# Patient Record
Sex: Female | Born: 1950 | Hispanic: Yes | Marital: Married | Smoking: Never smoker
Health system: Southern US, Community
[De-identification: ages and names within clinical notes are randomized; demographics above are authoritative.]

## PROBLEM LIST (undated history)

## (undated) DIAGNOSIS — R7303 Prediabetes: Secondary | ICD-10-CM

## (undated) DIAGNOSIS — K219 Gastro-esophageal reflux disease without esophagitis: Secondary | ICD-10-CM

## (undated) DIAGNOSIS — I10 Essential (primary) hypertension: Secondary | ICD-10-CM

## (undated) HISTORY — PX: NO PAST SURGERIES: SHX2092

---

## 2005-05-03 ENCOUNTER — Ambulatory Visit: Payer: Self-pay

## 2006-05-09 ENCOUNTER — Ambulatory Visit: Payer: Self-pay

## 2006-06-06 ENCOUNTER — Ambulatory Visit: Payer: Self-pay | Admitting: *Deleted

## 2007-01-24 ENCOUNTER — Ambulatory Visit: Payer: Self-pay

## 2008-01-11 ENCOUNTER — Other Ambulatory Visit: Payer: Self-pay

## 2008-01-11 ENCOUNTER — Inpatient Hospital Stay: Payer: Self-pay | Admitting: Internal Medicine

## 2008-03-18 ENCOUNTER — Ambulatory Visit: Payer: Self-pay

## 2011-08-15 ENCOUNTER — Ambulatory Visit: Payer: Self-pay

## 2013-02-19 ENCOUNTER — Ambulatory Visit: Payer: Self-pay

## 2014-05-03 ENCOUNTER — Emergency Department: Payer: Self-pay | Admitting: Emergency Medicine

## 2014-05-03 LAB — BASIC METABOLIC PANEL
ANION GAP: 7 (ref 7–16)
BUN: 18 mg/dL (ref 7–18)
CHLORIDE: 103 mmol/L (ref 98–107)
CO2: 29 mmol/L (ref 21–32)
Calcium, Total: 8.6 mg/dL (ref 8.5–10.1)
Creatinine: 1.17 mg/dL (ref 0.60–1.30)
EGFR (Non-African Amer.): 51 — ABNORMAL LOW
GFR CALC AF AMER: 59 — AB
GLUCOSE: 100 mg/dL — AB (ref 65–99)
OSMOLALITY: 280 (ref 275–301)
POTASSIUM: 3.5 mmol/L (ref 3.5–5.1)
Sodium: 139 mmol/L (ref 136–145)

## 2014-05-03 LAB — URINALYSIS, COMPLETE
Bilirubin,UR: NEGATIVE
GLUCOSE, UR: NEGATIVE mg/dL (ref 0–75)
KETONE: NEGATIVE
LEUKOCYTE ESTERASE: NEGATIVE
NITRITE: NEGATIVE
PH: 8 (ref 4.5–8.0)
Protein: NEGATIVE
RBC,UR: 11 /HPF (ref 0–5)
Specific Gravity: 1.005 (ref 1.003–1.030)
Squamous Epithelial: <1
WBC UR: 7 /HPF (ref 0–5)

## 2014-05-03 LAB — CBC WITH DIFFERENTIAL/PLATELET
BASOS ABS: 0.1 10*3/uL (ref 0.0–0.1)
Basophil %: 0.6 %
Eosinophil #: 0.1 10*3/uL (ref 0.0–0.7)
Eosinophil %: 1.6 %
HCT: 39.6 % (ref 35.0–47.0)
HGB: 13.1 g/dL (ref 12.0–16.0)
LYMPHS ABS: 1.9 10*3/uL (ref 1.0–3.6)
Lymphocyte %: 23.9 %
MCH: 28.4 pg (ref 26.0–34.0)
MCHC: 33.1 g/dL (ref 32.0–36.0)
MCV: 86 fL (ref 80–100)
Monocyte #: 0.6 x10 3/mm (ref 0.2–0.9)
Monocyte %: 8 %
NEUTROS ABS: 5.3 10*3/uL (ref 1.4–6.5)
Neutrophil %: 65.9 %
Platelet: 280 10*3/uL (ref 150–440)
RBC: 4.61 10*6/uL (ref 3.80–5.20)
RDW: 13.8 % (ref 11.5–14.5)
WBC: 8 10*3/uL (ref 3.6–11.0)

## 2014-05-03 LAB — TROPONIN I: Troponin-I: <0.02 ng/mL

## 2014-05-20 ENCOUNTER — Ambulatory Visit: Payer: Self-pay

## 2015-12-29 ENCOUNTER — Ambulatory Visit: Payer: Self-pay

## 2016-01-12 ENCOUNTER — Ambulatory Visit: Payer: Self-pay | Attending: Oncology | Admitting: *Deleted

## 2016-01-12 ENCOUNTER — Encounter: Payer: Self-pay | Admitting: *Deleted

## 2016-01-12 VITALS — BP 134/75 | HR 70 | Temp 97.1°F | Ht 60.24 in | Wt 165.3 lb

## 2016-01-12 DIAGNOSIS — N644 Mastodynia: Secondary | ICD-10-CM

## 2016-01-12 NOTE — Patient Instructions (Signed)
Gave patient hand-out, Women Staying Healthy, Active and Well from [facility], with education on breast health, pap smears, heart and colon health. 

## 2016-01-12 NOTE — Progress Notes (Signed)
Subjective:     Patient ID: Cristal FordCelia Martinez- Clarisa Flinge cruz, female   DOB: 09/24/1950, 65 y.o.   MRN: [id number]  HPI   Review of Systems     Objective:   Physical Exam  Pulmonary/Chest: Right breast exhibits no inverted nipple, no mass, no nipple discharge, no skin change and no tenderness. Left breast exhibits no mass, no nipple discharge, no skin change and no tenderness. Breasts are symmetrical.  Patient complains of a "throbbing" left nipple       Assessment:     65 year old Hispanic [facility]  Lloyda, the interpreter present during the interview and exam.  Patient states she has been experiencing a "throbbing" left nipple over the past several months.  States its intermediate, lasting only about a minute.  Denies any aggravating or alleviating factors.  Taught self breast awareness.  Last mammogram on 05/20/14 was a birads 1.  Patient has been screened for eligibility.  She does not have any insurance, Medicare or Medicaid.  She also meets financial eligibility.  Hand-out given on the Affordable Care Act.      Plan:     Will get bilateral diagnostic mammogram and ultrasound.  If no findings on imaging will have patient return in 3 months for re-exam.  She is agreeable to the plan.

## 2016-01-20 ENCOUNTER — Ambulatory Visit
Admission: RE | Admit: 2016-01-20 | Discharge: 2016-01-20 | Disposition: A | Discharge status: Home / Self Care | Payer: Self-pay | Source: Ambulatory Visit | Attending: Oncology | Admitting: Oncology

## 2016-01-20 DIAGNOSIS — N644 Mastodynia: Secondary | ICD-10-CM

## 2016-03-16 ENCOUNTER — Encounter: Payer: Self-pay | Admitting: *Deleted

## 2016-03-16 NOTE — Progress Notes (Signed)
Letter mailed from the [facility] to inform patient of her normal mammogram results.  Patient is to follow-up with cbe on 04/20/16 with [facility].  Next  annual screening in one year.  [facility] [place].

## 2016-04-19 ENCOUNTER — Ambulatory Visit: Payer: Self-pay

## 2017-03-12 ENCOUNTER — Ambulatory Visit: Payer: Self-pay | Attending: Oncology | Admitting: *Deleted

## 2017-03-12 ENCOUNTER — Encounter: Payer: Self-pay | Admitting: *Deleted

## 2017-03-12 ENCOUNTER — Ambulatory Visit
Admission: RE | Admit: 2017-03-12 | Discharge: 2017-03-12 | Disposition: A | Discharge status: Home / Self Care | Payer: Self-pay | Source: Ambulatory Visit | Attending: Oncology | Admitting: Oncology

## 2017-03-12 VITALS — BP 163/77 | HR 60 | Temp 98.4°F | Ht 62.0 in | Wt 168.0 lb

## 2017-03-12 DIAGNOSIS — Z0000 Encounter for general adult medical examination without abnormal findings: Secondary | ICD-10-CM

## 2017-03-12 NOTE — Progress Notes (Signed)
Subjective:     Patient ID: Molly Salas- Molly Salas, female   DOB: 26-Apr-1951, 66 y.o.   MRN: [id number]  HPI   Review of Systems     Objective:   Physical Exam  Pulmonary/Chest: Right breast exhibits no inverted nipple, no mass, no nipple discharge, no skin change and no tenderness. Left breast exhibits no inverted nipple, no mass, no nipple discharge, no skin change and no tenderness. Breasts are symmetrical.       Assessment:     66 [facility] Hispanic [facility]  Orson SlickJacqui, the interpreter present during the interview and exam.  Clinical breast exam unremarkable.  Patient states she has had 2 episodes of her left nipple throbbing since this time last year.  No aggravating or alleviating factors.  Blood pressure elevated at  163/77.  She is to pick up her meds since she has been out of them for a couple of days and then recheck her blood pressure at [facility] or [facility], and if remains higher than 140/90 she is to follow-up with her primary care provider.  Hand out on hypertention given to patient.  [facility] per [facility].  Next pap will be due in 2022 or per her results.  Patient has been screened for eligibility.  She does not have any insurance, Medicare or Medicaid.  She also meets financial eligibility.  Hand-out given on the Affordable Care Act.    Plan:     Screening mammogram ordered.  She is to take notes as to any factors associated with the "nipple throbbing" if she experiences it again.  She is agreeable.  Will follow-up per [facility] protocol.

## 2017-03-12 NOTE — Progress Notes (Signed)
Letter mailed to inform patient of her normal mammogram results.  She is to follow-up in one year with her annual screening mammogram.  [place]

## 2017-03-12 NOTE — Patient Instructions (Signed)
Hipertensin Hypertension La hipertensin, conocida comnmente como presin arterial alta, se produce cuando la sangre bombea en las arterias con mucha fuerza. Las arterias son los vasos sanguneos que transportan la sangre desde el corazn al resto del cuerpo. La hipertensin hace que el corazn haga ms esfuerzo para bombear sangre y puede provocar que las arterias se estrechen o endurezcan. La hipertensin no tratada o no controlada puede causar infarto de miocardio, accidentes cerebrovasculares, enfermedad renal y otros problemas. Una lectura de la presin arterial consiste de un nmero ms alto sobre un nmero ms bajo. En condiciones ideales, la presin arterial debe estar por debajo de 120/80. El primer nmero ("superior") es la presin sistlica. Es la medida de la presin de las arterias cuando el corazn late. El segundo nmero ("inferior") es la presin diastlica. Es la medida de la presin en las arterias cuando el corazn se relaja. Cules son las causas? Se desconoce la causa de esta afeccin. Qu incrementa el riesgo? Algunos factores de riesgo de hipertensin estn bajo su control. Otros no. Factores que puede modificar  Fumar.  Tener diabetes mellitus tipo 2, colesterol alto, o ambos.  No hacer la cantidad suficiente de actividad fsica o ejercicio.  Tener sobrepeso.  Consumir mucha grasa, azcar, caloras o sal (sodio) en su dieta.  Beber alcohol en exceso. Factores que son difciles o imposibles de modificar  Tener enfermedad renal crnica.  Tener antecedentes familiares de presin arterial alta.  La edad. Los riesgos aumentan con la edad.  La raza. El riesgo es mayor para las personas afroamericanas.  El sexo. Antes de los 45aos, los hombres corren ms riesgo que las mujeres. Despus de los 65aos, las mujeres corren ms riesgo que los hombres.  Tener apnea obstructiva del sueo.  El estrs. Cules son los signos o los sntomas? La presin arterial  extremadamente alta (crisis hipertensiva) puede provocar:  Dolor de cabeza.  Ansiedad.  Falta de aire.  Hemorragia nasal.  Nuseas y vmitos.  Dolor de pecho intenso.  Una crisis de movimientos que no puede controlar (convulsiones).  Cmo se diagnostica? Esta afeccin se diagnostica midiendo su presin arterial mientras se encuentra sentado, con el brazo apoyado sobre una superficie. El brazalete del tensimetro debe colocarse directamente sobre la piel de la parte superior del brazo y al nivel de su corazn. Debe medirla al menos dos veces en el mismo brazo. Determinadas condiciones pueden causar una diferencia de presin arterial entre el brazo izquierdo y el derecho. Ciertos factores pueden provocar que las lecturas de la presin arterial sean inferiores o superiores a lo normal (elevadas) por un perodo corto de tiempo:  Si su presin arterial es ms alta cuando se encuentra en el consultorio del mdico que cuando la mide en su hogar, se denomina "hipertensin de bata blanca". La mayora de las personas que tienen esta afeccin no deben ser medicadas.  Si su presin arterial es ms alta en el hogar que cuando se encuentra en el consultorio del mdico, se denomina "hipertensin enmascarada". La mayora de las personas que tienen esta afeccin deben ser medicadas para controlar la presin arterial.  Si tiene una lecturas de presin arterial alta durante una visita o si tiene presin arterial normal con otros factores de riesgo:  Es posible que se le pida que regrese otro da para volver a controlar su presin arterial.  Se le puede pedir que se controle la presin arterial en su casa durante 1 semana o ms.  Si se le diagnostica hipertensin, es posible que   se le realicen otros anlisis de sangre o estudios de diagnstico por imgenes para ayudar a su mdico a comprender su riesgo general de tener otras afecciones. Cmo se trata? Esta afeccin se trata haciendo cambios saludables  en el estilo de vida, tales como ingerir alimentos saludables, realizar ms ejercicio y reducir el consumo de alcohol. El mdico puede recetarle medicamentos si los cambios en el estilo de vida no son suficientes para lograr controlar la presin arterial y si:  Su presin arterial sistlica est por encima de 130.  Su presin arterial diastlica est por encima de 80.  La presin arterial deseada puede variar en funcin de las enfermedades, la edad y otros factores personales. Siga estas instrucciones en su casa: Comida y bebida  Siga una dieta con alto contenido de fibras y potasio, y con bajo contenido de sodio, azcar agregada y grasas. Un ejemplo de plan alimenticio es la dieta DASH (Dietary Approaches to Stop Hypertension, Mtodos alimenticios para detener la hipertensin). Para alimentarse de esta manera: ? Coma mucha fruta y verdura fresca. Trate de que la mitad del plato de cada comida sea de frutas y verduras. ? Coma cereales integrales, como pasta integral, arroz integral y pan integral. Llene aproximadamente un cuarto del plato con cereales integrales. ? Coma y beba productos lcteos con bajo contenido de grasa, como leche descremada o yogur bajo en grasas. ? Evite la ingesta de cortes de carne grasa, carne procesada o curada, y carne de ave con piel. Llene aproximadamente un cuarto del plato con protenas magras, como pescado, pollo sin piel, frijoles, huevos y tofu. ? Evite ingerir alimentos prehechos o procesados. En general, estos tienen mayor cantidad de sodio, azcar agregada y grasa.  Reduzca su ingesta diaria de sodio. La mayora de las personas que tienen hipertensin deben comer menos de 1500 mg de sodio por da.  Limite el consumo de alcohol a no ms de 1 medida por da si es mujer y no est embarazada y a 2 medidas por da si es hombre. Una medida equivale a 12onzas de cerveza, 5onzas de vino o 1onzas de bebidas alcohlicas de alta graduacin. Estilo de vida  Trabaje  con su mdico para mantener un peso saludable o perder peso. Pregntele cual es su peso recomendado.  Realice al menos 30 minutos de ejercicio que haga que se acelere su corazn (ejercicio aerbico) la mayora de los das de la semana. Estas actividades pueden incluir caminar, nadar o andar en bicicleta.  Incluya ejercicios para fortalecer sus msculos (ejercicios de resistencia), como pilates o levantamiento de pesas, como parte de su rutina semanal de ejercicios. Intente realizar 30minutos de este tipo de ejercicios al menos tres das a la semana.  No consuma ningn producto que contenga nicotina o tabaco, como cigarrillos y cigarrillos electrnicos. Si necesita ayuda para dejar de fumar, consulte al mdico.  Contrlese la presin arterial en su casa segn las indicaciones del mdico.  Concurra a todas las visitas de control como se lo haya indicado el mdico. Esto es importante. Medicamentos  Tome los medicamentos de venta libre y los recetados solamente como se lo haya indicado el mdico. Siga cuidadosamente las indicaciones. Los medicamentos para la presin arterial deben tomarse segn las indicaciones.  No omita las dosis de medicamentos para la presin arterial. Si lo hace, estar en riesgo de tener problemas y puede hacer que los medicamentos sean menos eficaces.  Pregntele a su mdico a qu efectos secundarios o reacciones a los medicamentos debe prestar atencin. Comunquese con   un mdico si:  Piensa que tiene una reaccin a un medicamento que est tomando.  Tiene dolores de cabeza frecuentes (recurrentes).  Siente mareos.  Tiene hinchazn en los tobillos.  Tiene problemas de visin. Solicite ayuda de inmediato si:  Siente un dolor de cabeza intenso o confusin.  Siente debilidad inusual o adormecimiento.  Siente que va a desmayarse.  Siente un dolor intenso en el pecho o el abdomen.  Vomita repetidas veces.  Tiene dificultad para respirar. Resumen  La  hipertensin se produce cuando la sangre bombea en las arterias con mucha fuerza. Si esta afeccin no se controla, podra correr riesgo de tener complicaciones graves.  La presin arterial deseada puede variar en funcin de las enfermedades, la edad y otros factores personales. Para la Franklin Resourcesmayora de las personas, una presin arterial normal es menor que 120/80.  La hipertensin se trata con cambios en el estilo de vida, medicamentos o una combinacin de [place]ambos. Los Danaher Corporationcambios en el estilo de vida incluyen prdida de peso, ingerir alimentos sanos, seguir una dieta baja en sodio, hacer ms ejercicio y Glass blower/designerlimitar el consumo de alcohol. Esta informacin no tiene Theme park managercomo fin reemplazar el consejo del mdico. Asegrese de hacerle al mdico cualquier pregunta que tenga. Document Released: 08/21/2005 Document Revised: 08/02/2016 Document Reviewed: 08/02/2016  Gave patient hand-out, [facility] St[facility] Healthy, [facility] and [facility], with education on breast health, pap smears, heart and colon health. Risk analystlsevier Interactive Patient Education  [employer]2018 Elsevier Inc.

## 2019-06-23 ENCOUNTER — Other Ambulatory Visit: Payer: Self-pay | Admitting: Physician Assistant

## 2019-06-23 DIAGNOSIS — Z1231 Encounter for screening mammogram for malignant neoplasm of breast: Secondary | ICD-10-CM

## 2019-12-09 ENCOUNTER — Emergency Department
Admission: EM | Admit: 2019-12-09 | Discharge: 2019-12-09 | Disposition: A | Discharge status: Home / Self Care | Payer: Medicaid Other | Attending: Emergency Medicine | Admitting: Emergency Medicine

## 2019-12-09 ENCOUNTER — Other Ambulatory Visit: Payer: Self-pay

## 2019-12-09 ENCOUNTER — Emergency Department: Payer: Medicaid Other

## 2019-12-09 DIAGNOSIS — Y9389 Activity, other specified: Secondary | ICD-10-CM | POA: Diagnosis not present

## 2019-12-09 DIAGNOSIS — Z7982 Long term (current) use of aspirin: Secondary | ICD-10-CM | POA: Insufficient documentation

## 2019-12-09 DIAGNOSIS — M25511 Pain in right shoulder: Secondary | ICD-10-CM

## 2019-12-09 DIAGNOSIS — Z79899 Other long term (current) drug therapy: Secondary | ICD-10-CM | POA: Diagnosis not present

## 2019-12-09 DIAGNOSIS — Y999 Unspecified external cause status: Secondary | ICD-10-CM | POA: Insufficient documentation

## 2019-12-09 DIAGNOSIS — X509XXA Other and unspecified overexertion or strenuous movements or postures, initial encounter: Secondary | ICD-10-CM | POA: Diagnosis not present

## 2019-12-09 DIAGNOSIS — Y929 Unspecified place or not applicable: Secondary | ICD-10-CM | POA: Diagnosis not present

## 2019-12-09 DIAGNOSIS — M25512 Pain in left shoulder: Secondary | ICD-10-CM | POA: Diagnosis not present

## 2019-12-09 MED ORDER — LIDOCAINE 5 % EX PTCH: 1.0000 | MEDICATED_PATCH | Freq: Two times a day (BID) | CUTANEOUS | 0 refills | Status: AC

## 2019-12-09 MED ORDER — TRAMADOL HCL 50 MG PO TABS: 50.0000 mg | ORAL_TABLET | Freq: Two times a day (BID) | ORAL | 0 refills | Status: AC | PRN

## 2019-12-09 NOTE — ED Notes (Signed)
Pt states unable to sign for discharge d/t tremors, difficulty holding the pen.  Interpreter used to go over discharge instructions. Pt denies further questions or concerns

## 2019-12-09 NOTE — Discharge Instructions (Addendum)
Follow discharge care instruction take medication as directed.  You need to follow orthopedic for definitive evaluation and treatment of this condition.

## 2019-12-09 NOTE — ED Triage Notes (Addendum)
interpreter used for triage. Pt reports right shoulder pain, worse with use and effort against gravity X 4 months. Reports 09/08/19 is when pain began, possible injury that date-has had massage to arm without relief. Denies CP, denies SOB.  Pt alert and oriented X4, cooperative, RR even and unlabored, color WNL. Pt in NAD.

## 2019-12-09 NOTE — ED Provider Notes (Signed)
[facility] Emergency Department Provider Note   ____________________________________________   First MD Initiated Contact with Patient 12/09/19 1144     (approximate)  I have reviewed the triage vital signs and the nursing notes.   HISTORY  Chief Complaint Shoulder Pain    HPI Molly Salas is a 69 y.o. female patient presents with 4 months of right shoulder pain.  Patient said pain increased with adduction and overhead reaching.  No specific provocative incident for complaint.  Patient also having pain in the left shoulder which is consistent with degenerative changes per x-ray.  Patient rates the pain as a 7/10.  Patient scribed pain is "achy".  Mild relief with anti-inflammatory medications.         History reviewed. No pertinent past medical history.  There are no problems to display for this patient.   History reviewed. No pertinent surgical history.  Prior to Admission medications   Medication Sig Start Date End Date Taking? Authorizing Provider  aspirin EC 81 MG tablet Take 81 mg by mouth daily.   Yes [provider]  gabapentin (NEURONTIN) 300 MG capsule Take 300 mg by mouth at bedtime.   Yes [provider]  lisinopril-hydrochlorothiazide (ZESTORETIC) 10-12.5 MG tablet Take 1 tablet by mouth daily.   Yes [provider]  naproxen (NAPROSYN) 500 MG tablet Take 500 mg by mouth 2 (two) times daily with a meal.   Yes [provider]  hydrochlorothiazide (HYDRODIURIL) 12.5 MG tablet Take 12.5 mg by mouth.    [provider]  lidocaine (LIDODERM) 5 % Place 1 patch onto the skin every 12 (twelve) hours. Remove & Discard patch within 12 hours or as directed by MD 12/09/19 12/08/20  Sable Feil, PA-C  traMADol (ULTRAM) 50 MG tablet Take 1 tablet (50 mg total) by mouth every 12 (twelve) hours as needed. 12/09/19   Sable Feil, PA-C    Allergies Patient has no known allergies.  Family  History  Problem Relation Age of Onset  . Breast cancer Neg Hx     Social History Social History   Tobacco Use  . Smoking status: Never Smoker  Substance Use Topics  . Alcohol use: Not on file  . Drug use: Not on file    Review of Systems Constitutional: No fever/chills Eyes: No visual changes. ENT: No sore throat. Cardiovascular: Denies chest pain. Respiratory: Denies shortness of breath. Gastrointestinal: No abdominal pain.  No nausea, no vomiting.  No diarrhea.  No constipation. Genitourinary: Negative for dysuria. Musculoskeletal: Negative for back pain. Skin: Negative for rash. Neurological: Negative for headaches, focal weakness or numbness. Endocrine:  Hypertension ____________________________________________   PHYSICAL EXAM:  VITAL SIGNS: ED Triage Vitals  Enc Vitals Group     BP 12/09/19 1119 (!) 158/66     Pulse Rate 12/09/19 1119 93     Resp 12/09/19 1119 16     Temp 12/09/19 1119 99 F (37.2 C)     Temp Source 12/09/19 1119 Oral     SpO2 12/09/19 1119 99 %     Weight 12/09/19 1129 150 lb (68 kg)     Height 12/09/19 1129 5\' 3"  (1.6 m)     Head Circumference --      Peak Flow --      Pain Score 12/09/19 1129 7     Pain Loc --      Pain Edu? --      Excl. in [place]? --    Constitutional:  Alert and oriented. Well appearing and in no acute distress. Neck: No cervical spine tenderness to palpation. Hematological/Lymphatic/Immunilogical: No cervical lymphadenopathy. Cardiovascular: Normal rate, regular rhythm. Grossly normal heart sounds.  Good peripheral circulation.  Elevated blood pressure. Respiratory: Normal respiratory effort.  No retractions. Lungs CTAB. Musculoskeletal: No obvious deformity to the right shoulder.  Patient has decreased range of motion with adduction overhead reaching.  Patient is moderate guarding palpation at the humeral head.  No lower extremity tenderness nor edema.  No joint effusions. Neurologic:  Normal speech and language. No  gross focal neurologic deficits are appreciated. No gait instability. Skin:  Skin is warm, dry and intact. No rash noted. Psychiatric: Mood and affect are normal. Speech and behavior are normal.  ____________________________________________   LABS (all labs ordered are listed, but only abnormal results are displayed)  Labs Reviewed - No data to display ____________________________________________  EKG   ____________________________________________  RADIOLOGY  ED MD interpretation:    Official radiology report(s): DG Shoulder Right  Result Date: 12/09/2019 CLINICAL DATA:  Four months of increasing nontraumatic right shoulder pain EXAM: RIGHT SHOULDER - 2+ VIEW COMPARISON:  None FINDINGS: There is no evidence of fracture or dislocation. Mild acromioclavicular degenerative changes. Potential enthesopathy along the greater tuberosity. IMPRESSION: No acute fracture or dislocation. Mild acromioclavicular degenerative changes. Electronically Signed   By: Donzetta Kohut M.D.   On: 12/09/2019 12:20    ____________________________________________   PROCEDURES  Procedure(s) performed (including Critical Care):  Procedures   ____________________________________________   INITIAL IMPRESSION / ASSESSMENT AND PLAN / ED COURSE  As part of my medical decision making, I reviewed the following data within the electronic MEDICAL RECORD NUMBER     Right shoulder pain secondary to degenerative changes.  Discussed x-ray findings with patient.  Patient given discharge care instruction advised follow orthopedic for  definitive evaluation and treatment.     ____________________________________________   FINAL CLINICAL IMPRESSION(S) / ED DIAGNOSES  Final diagnoses:  Acute pain of right shoulder     ED Discharge Orders         Ordered    traMADol (ULTRAM) 50 MG tablet  Every 12 hours PRN     12/09/19 1254    lidocaine (LIDODERM) 5 %  Every 12 hours     12/09/19 1254            Note:  This document was prepared using Dragon voice recognition software and may include unintentional dictation errors.    Joni Reining, PA-C 12/09/19 1258    Dionne Bucy, MD 12/09/19 [id number]

## 2019-12-09 NOTE — ED Notes (Addendum)
See triage note  Presents with right shoulder pain which started about 4 months ago   States she did not fall but did a lot of cleaning   Pain is anterior shoulder and into arm

## 2020-06-28 ENCOUNTER — Ambulatory Visit
Admission: RE | Admit: 2020-06-28 | Discharge: 2020-06-28 | Disposition: A | Discharge status: Home / Self Care | Payer: Medicaid Other | Source: Ambulatory Visit | Attending: Physician Assistant | Admitting: Physician Assistant

## 2020-06-28 ENCOUNTER — Other Ambulatory Visit: Payer: Self-pay

## 2020-06-28 DIAGNOSIS — Z1231 Encounter for screening mammogram for malignant neoplasm of breast: Secondary | ICD-10-CM | POA: Insufficient documentation

## 2021-07-12 ENCOUNTER — Other Ambulatory Visit: Payer: Self-pay | Admitting: Physician Assistant

## 2021-07-12 DIAGNOSIS — Z1231 Encounter for screening mammogram for malignant neoplasm of breast: Secondary | ICD-10-CM

## 2021-07-18 ENCOUNTER — Ambulatory Visit
Admission: RE | Admit: 2021-07-18 | Discharge: 2021-07-18 | Disposition: A | Discharge status: Home / Self Care | Payer: Medicaid Other | Source: Ambulatory Visit | Attending: Physician Assistant | Admitting: Physician Assistant

## 2021-07-18 ENCOUNTER — Other Ambulatory Visit: Payer: Self-pay

## 2021-07-18 DIAGNOSIS — Z1231 Encounter for screening mammogram for malignant neoplasm of breast: Secondary | ICD-10-CM | POA: Insufficient documentation

## 2022-03-29 ENCOUNTER — Encounter: Payer: Self-pay | Admitting: Ophthalmology

## 2022-04-03 NOTE — Discharge Instructions (Signed)

## 2022-04-05 ENCOUNTER — Ambulatory Visit
Admission: RE | Admit: 2022-04-05 | Discharge: 2022-04-05 | Disposition: A | Discharge status: Home / Self Care | Payer: Medicaid Other | Attending: Ophthalmology | Admitting: Ophthalmology

## 2022-04-05 ENCOUNTER — Encounter: Payer: Self-pay | Admitting: Ophthalmology

## 2022-04-05 ENCOUNTER — Encounter
Admission: RE | Disposition: A | Discharge status: Home / Self Care | Payer: Self-pay | Source: Home / Self Care | Attending: Ophthalmology

## 2022-04-05 ENCOUNTER — Ambulatory Visit: Payer: Medicaid Other | Admitting: Anesthesiology

## 2022-04-05 ENCOUNTER — Ambulatory Visit (AMBULATORY_SURGERY_CENTER): Payer: Medicaid Other | Admitting: Anesthesiology

## 2022-04-05 ENCOUNTER — Other Ambulatory Visit: Payer: Self-pay

## 2022-04-05 DIAGNOSIS — Z7984 Long term (current) use of oral hypoglycemic drugs: Secondary | ICD-10-CM | POA: Diagnosis not present

## 2022-04-05 DIAGNOSIS — H2512 Age-related nuclear cataract, left eye: Secondary | ICD-10-CM | POA: Diagnosis present

## 2022-04-05 DIAGNOSIS — I10 Essential (primary) hypertension: Secondary | ICD-10-CM | POA: Diagnosis not present

## 2022-04-05 DIAGNOSIS — E119 Type 2 diabetes mellitus without complications: Secondary | ICD-10-CM

## 2022-04-05 HISTORY — PX: CATARACT EXTRACTION W/PHACO: SHX586

## 2022-04-05 HISTORY — DX: Gastro-esophageal reflux disease without esophagitis: K21.9

## 2022-04-05 HISTORY — DX: Prediabetes: R73.03

## 2022-04-05 HISTORY — DX: Essential (primary) hypertension: I10

## 2022-04-05 SURGERY — PHACOEMULSIFICATION, CATARACT, WITH IOL INSERTION
Anesthesia: Monitor Anesthesia Care | Site: Eye | Laterality: Left

## 2022-04-05 MED ORDER — LACTATED RINGERS IV SOLN
INTRAVENOUS | Status: DC
Start: 2022-04-05 — End: 2022-04-05

## 2022-04-05 MED ORDER — SIGHTPATH DOSE#1 BSS IO SOLN
INTRAOCULAR | Status: DC | PRN
  Administered 2022-04-05: 15 mL

## 2022-04-05 MED ORDER — SIGHTPATH DOSE#1 BSS IO SOLN
INTRAOCULAR | Status: DC | PRN
  Administered 2022-04-05: 1 mL via INTRAMUSCULAR

## 2022-04-05 MED ORDER — CEFUROXIME OPHTHALMIC INJECTION 1 MG/0.1 ML
INJECTION | OPHTHALMIC | Status: DC | PRN
  Administered 2022-04-05: 0.1 mL via INTRACAMERAL

## 2022-04-05 MED ORDER — SIGHTPATH DOSE#1 BSS IO SOLN
INTRAOCULAR | Status: DC | PRN
  Administered 2022-04-05: 77 mL via OPHTHALMIC

## 2022-04-05 MED ORDER — FENTANYL CITRATE (PF) 100 MCG/2ML IJ SOLN
INTRAMUSCULAR | Status: DC | PRN
  Administered 2022-04-05: 50 ug via INTRAVENOUS

## 2022-04-05 MED ORDER — BRIMONIDINE TARTRATE-TIMOLOL 0.2-0.5 % OP SOLN
OPHTHALMIC | Status: DC | PRN
  Administered 2022-04-05: 1 [drp] via OPHTHALMIC

## 2022-04-05 MED ORDER — ARMC OPHTHALMIC DILATING DROPS
1.0000 | OPHTHALMIC | Status: DC | PRN
  Administered 2022-04-05 (×3): 1 via OPHTHALMIC

## 2022-04-05 MED ORDER — TETRACAINE HCL 0.5 % OP SOLN
1.0000 [drp] | OPHTHALMIC | Status: DC | PRN
  Administered 2022-04-05 (×3): 1 [drp] via OPHTHALMIC

## 2022-04-05 MED ORDER — SIGHTPATH DOSE#1 NA HYALUR & NA CHOND-NA HYALUR IO KIT
PACK | INTRAOCULAR | Status: DC | PRN
  Administered 2022-04-05: 1 via OPHTHALMIC

## 2022-04-05 MED ORDER — MIDAZOLAM HCL 2 MG/2ML IJ SOLN
INTRAMUSCULAR | Status: DC | PRN
  Administered 2022-04-05: 1 mg via INTRAVENOUS

## 2022-04-05 SURGICAL SUPPLY — 11 items
CATARACT SUITE SIGHTPATH (MISCELLANEOUS) ×2 IMPLANT
FEE CATARACT SUITE SIGHTPATH (MISCELLANEOUS) ×1 IMPLANT
GLOVE SRG 8 PF TXTR STRL LF DI (GLOVE) ×1 IMPLANT
GLOVE SURG ENC TEXT LTX SZ7.5 (GLOVE) ×2 IMPLANT
GLOVE SURG UNDER POLY LF SZ8 (GLOVE) ×2
LENS IOL TECNIS EYHANCE 27.5 (Intraocular Lens) ×1 IMPLANT
NDL FILTER BLUNT 18X1 1/2 (NEEDLE) ×1 IMPLANT
NEEDLE FILTER BLUNT 18X 1/2SAF (NEEDLE) ×1
NEEDLE FILTER BLUNT 18X1 1/2 (NEEDLE) ×1 IMPLANT
SYR 3ML LL SCALE MARK (SYRINGE) ×2 IMPLANT
WATER STERILE IRR 250ML POUR (IV SOLUTION) ×2 IMPLANT

## 2022-04-05 NOTE — Transfer of Care (Signed)
Immediate Anesthesia Transfer of Care Note  Patient: Molly Salas  Procedure(s) Performed: CATARACT EXTRACTION PHACO AND INTRAOCULAR LENS PLACEMENT (IOC) LEFT 7.40 01:18.6 (Left: Eye)  Patient Location: PACU  Anesthesia Type:MAC  Level of Consciousness: awake, alert  and oriented  Airway & Oxygen Therapy: Patient Spontanous Breathing  Post-op Assessment: Report given to RN and Post -op Vital signs reviewed and stable  Post vital signs: Reviewed  Last Vitals:  Vitals Value Taken Time  BP    Temp    Pulse    Resp    SpO2      Last Pain:  Vitals:   04/05/22 0752  PainSc: 0-No pain         Complications: No notable events documented.

## 2022-04-05 NOTE — H&P (Signed)
[facility]   Primary Care Physician:  Marya Fossa, PA-C Ophthalmologist: Dr. Lockie Mola  Pre-Procedure History & Physical: HPI:  Molly Salas is a 71 y.o. female here for ophthalmic surgery.   Past Medical History:  Diagnosis Date   GERD (gastroesophageal reflux disease)    Hypertension    Pre-diabetes     Past Surgical History:  Procedure Laterality Date   NO PAST SURGERIES      Prior to Admission medications   Medication Sig Start Date End Date Taking? Authorizing Provider  aspirin EC 81 MG tablet Take 81 mg by mouth daily.   Yes [provider]  cholecalciferol (VITAMIN D3) 25 MCG (1000 UNIT) tablet Take 1,000 Units by mouth daily.   Yes [provider]  gabapentin (NEURONTIN) 300 MG capsule Take 300 mg by mouth at bedtime.   Yes [provider]  lisinopril-hydrochlorothiazide (ZESTORETIC) 10-12.5 MG tablet Take 1 tablet by mouth daily.   Yes [provider]  Multiple Vitamin (MULTIVITAMIN) tablet Take 1 tablet by mouth daily.   Yes [provider]  naproxen (NAPROSYN) 500 MG tablet Take 500 mg by mouth 2 (two) times daily with a meal.   Yes [provider]  omeprazole (PRILOSEC) 20 MG capsule Take 20 mg by mouth daily.   Yes [provider]  pravastatin (PRAVACHOL) 20 MG tablet Take 20 mg by mouth daily.   Yes [provider]  traMADol (ULTRAM) 50 MG tablet Take 1 tablet (50 mg total) by mouth every 12 (twelve) hours as needed. 12/09/19  Yes Joni Reining, PA-C  hydrochlorothiazide (HYDRODIURIL) 12.5 MG tablet Take 12.5 mg by mouth.    [provider]    Allergies as of 01/18/2022   (No Known Allergies)    Family History  Problem Relation Age of Onset   Breast cancer Neg Hx     Social History   Socioeconomic History   Marital status: Married    Spouse name: Not on file   Number of children: Not on file   Years of education: Not on file   Highest  education level: Not on file  Occupational History   Not on file  Tobacco Use   Smoking status: Never    Passive exposure: Current   Smokeless tobacco: Never  Vaping Use   Vaping Use: Never used  Substance and Sexual Activity   Alcohol use: Not on file   Drug use: Not on file   Sexual activity: Not on file  Other Topics Concern   Not on file  Social History Narrative   Not on file   Social Determinants of Health   Financial Resource Strain: Not on file  Food Insecurity: Not on file  Transportation Needs: Not on file  Physical Activity: Not on file  Stress: Not on file  Social Connections: Not on file  Intimate Partner Violence: Not on file    Review of Systems: See HPI, otherwise negative ROS  Physical Exam: BP (!) 169/75   Pulse 91   Temp (!) 97.4 F (36.3 C)   Ht 4\' 11"  (1.499 m)   Wt 68 kg   SpO2 100%   BMI 30.28 kg/m  General:   Alert,  pleasant and cooperative in NAD Head:  Normocephalic and atraumatic. Lungs:  Clear to auscultation.    Heart:  Regular rate and rhythm.   Impression/Plan: Molly Salas is here for ophthalmic surgery.  Risks, benefits, limitations, and alternatives regarding ophthalmic surgery have  been reviewed with the patient.  Questions have been answered.  All parties agreeable.   Lockie Mola, MD  04/05/2022, 8:18 AM

## 2022-04-05 NOTE — Anesthesia Postprocedure Evaluation (Signed)
Anesthesia Post Note  Patient: Molly Salas  Procedure(s) Performed: CATARACT EXTRACTION PHACO AND INTRAOCULAR LENS PLACEMENT (IOC) LEFT 7.40 01:18.6 (Left: Eye)     Patient location during evaluation: PACU Anesthesia Type: MAC Level of consciousness: awake and alert Pain management: pain level controlled Vital Signs Assessment: post-procedure vital signs reviewed and stable Respiratory status: spontaneous breathing, nonlabored ventilation, respiratory function stable and patient connected to nasal cannula oxygen Cardiovascular status: blood pressure returned to baseline and stable Postop Assessment: no apparent nausea or vomiting Anesthetic complications: no   No notable events documented.  [name]

## 2022-04-05 NOTE — Anesthesia Preprocedure Evaluation (Signed)
Anesthesia Evaluation  Patient identified by MRN, date of birth, ID band Patient awake    Reviewed: Allergy & Precautions, H&P , NPO status , Patient's Chart, lab work & pertinent test results, reviewed documented beta blocker date and time   Airway Mallampati: II  TM Distance: >3 FB Neck ROM: full    Dental no notable dental hx. (+) Teeth Intact   Pulmonary neg pulmonary ROS,    Pulmonary exam normal breath sounds clear to auscultation       Cardiovascular Exercise Tolerance: Good hypertension, On Medications negative cardio ROS   Rhythm:regular Rate:Normal     Neuro/Psych negative neurological ROS  negative psych ROS   GI/Hepatic Neg liver ROS, GERD  Medicated,  Endo/Other  negative endocrine ROSdiabetes, Well Controlled, Type 2, Oral Hypoglycemic Agents  Renal/GU      Musculoskeletal   Abdominal   Peds  Hematology negative hematology ROS (+)   Anesthesia Other Findings   Reproductive/Obstetrics negative OB ROS                             Anesthesia Physical Anesthesia Plan  ASA: 2  Anesthesia Plan: MAC   Post-op Pain Management:    Induction:   PONV Risk Score and Plan:   Airway Management Planned:   Additional Equipment:   Intra-op Plan:   Post-operative Plan:   Informed Consent: I have reviewed the patients History and Physical, chart, labs and discussed the procedure including the risks, benefits and alternatives for the proposed anesthesia with the patient or authorized representative who has indicated his/her understanding and acceptance.       Plan Discussed with: CRNA  Anesthesia Plan Comments:         Anesthesia Quick Evaluation

## 2022-04-05 NOTE — Op Note (Signed)
OPERATIVE NOTE  Molly Salas [id number] 04/05/2022   PREOPERATIVE DIAGNOSIS:  Nuclear sclerotic cataract left eye. H25.12   POSTOPERATIVE DIAGNOSIS:    Nuclear sclerotic cataract left eye.     PROCEDURE:  Phacoemusification with posterior chamber intraocular lens placement of the left eye  Ultrasound time: Procedure(s): CATARACT EXTRACTION PHACO AND INTRAOCULAR LENS PLACEMENT (IOC) LEFT 7.40 01:18.6 (Left)  LENS:   Implant Name Type Inv. Item Serial No. Manufacturer Lot No. LRB No. Used Action  LENS IOL TECNIS EYHANCE 27.5 - [id number] Intraocular Lens LENS IOL TECNIS EYHANCE 27.5 [id number] [employer]  Left 1 Implanted      SURGEON:  Deirdre Evener, MD   ANESTHESIA:  Topical with tetracaine drops and 2% Xylocaine jelly, augmented with 1% preservative-free intracameral lidocaine.    COMPLICATIONS:  None.   DESCRIPTION OF PROCEDURE:  The patient was identified in the holding room and transported to the operating room and placed in the supine position under the operating microscope.  The left eye was identified as the operative eye and it was prepped and draped in the usual sterile ophthalmic fashion.   A 1 millimeter clear-corneal paracentesis was made at the 1:30 position.  0.5 ml of preservative-free 1% lidocaine was injected into the anterior chamber.  The anterior chamber was filled with Viscoat viscoelastic.  A 2.4 millimeter keratome was used to make a near-clear corneal incision at the 10:30 position.  .  A curvilinear capsulorrhexis was made with a cystotome and capsulorrhexis forceps.  Balanced salt solution was used to hydrodissect and hydrodelineate the nucleus.   Phacoemulsification was then used in stop and chop fashion to remove the lens nucleus and epinucleus.  The remaining cortex was then removed using the irrigation and aspiration handpiece. Provisc was then placed into the capsular bag to distend it for lens placement.  A lens was then injected into  the capsular bag.  The remaining viscoelastic was aspirated.   Wounds were hydrated with balanced salt solution.  The anterior chamber was inflated to a physiologic pressure with balanced salt solution.  No wound leaks were noted. Cefuroxime 0.1 ml of a 10mg /ml solution was injected into the anterior chamber for a dose of 1 mg of intracameral antibiotic at the completion of the case.   Timolol and Brimonidine drops were applied to the eye.  The patient was taken to the recovery room in stable condition without complications of anesthesia or surgery.  [name] 04/05/2022, 9:10 AM

## 2022-04-06 ENCOUNTER — Encounter: Payer: Self-pay | Admitting: Ophthalmology

## 2022-04-11 ENCOUNTER — Encounter: Payer: Self-pay | Admitting: Ophthalmology

## 2022-04-17 NOTE — Discharge Instructions (Signed)

## 2022-04-19 ENCOUNTER — Ambulatory Visit: Payer: Medicaid Other | Admitting: Anesthesiology

## 2022-04-19 ENCOUNTER — Encounter
Admission: RE | Disposition: A | Discharge status: Home / Self Care | Payer: Self-pay | Source: Home / Self Care | Attending: Ophthalmology

## 2022-04-19 ENCOUNTER — Ambulatory Visit
Admission: RE | Admit: 2022-04-19 | Discharge: 2022-04-19 | Disposition: A | Discharge status: Home / Self Care | Payer: Medicaid Other | Attending: Ophthalmology | Admitting: Ophthalmology

## 2022-04-19 ENCOUNTER — Encounter: Payer: Self-pay | Admitting: Ophthalmology

## 2022-04-19 ENCOUNTER — Ambulatory Visit (AMBULATORY_SURGERY_CENTER): Payer: Medicaid Other | Admitting: Anesthesiology

## 2022-04-19 ENCOUNTER — Other Ambulatory Visit: Payer: Self-pay

## 2022-04-19 DIAGNOSIS — I10 Essential (primary) hypertension: Secondary | ICD-10-CM | POA: Insufficient documentation

## 2022-04-19 DIAGNOSIS — H2511 Age-related nuclear cataract, right eye: Secondary | ICD-10-CM | POA: Insufficient documentation

## 2022-04-19 DIAGNOSIS — K219 Gastro-esophageal reflux disease without esophagitis: Secondary | ICD-10-CM | POA: Diagnosis not present

## 2022-04-19 DIAGNOSIS — Z7984 Long term (current) use of oral hypoglycemic drugs: Secondary | ICD-10-CM

## 2022-04-19 DIAGNOSIS — E119 Type 2 diabetes mellitus without complications: Secondary | ICD-10-CM | POA: Diagnosis not present

## 2022-04-19 DIAGNOSIS — Z7722 Contact with and (suspected) exposure to environmental tobacco smoke (acute) (chronic): Secondary | ICD-10-CM

## 2022-04-19 HISTORY — PX: CATARACT EXTRACTION W/PHACO: SHX586

## 2022-04-19 SURGERY — PHACOEMULSIFICATION, CATARACT, WITH IOL INSERTION
Anesthesia: Monitor Anesthesia Care | Site: Eye | Laterality: Right

## 2022-04-19 MED ORDER — SIGHTPATH DOSE#1 NA HYALUR & NA CHOND-NA HYALUR IO KIT
PACK | INTRAOCULAR | Status: DC | PRN
  Administered 2022-04-19: 1 via OPHTHALMIC

## 2022-04-19 MED ORDER — MIDAZOLAM HCL 2 MG/2ML IJ SOLN
INTRAMUSCULAR | Status: DC | PRN
  Administered 2022-04-19: 1 mg via INTRAVENOUS

## 2022-04-19 MED ORDER — FENTANYL CITRATE (PF) 100 MCG/2ML IJ SOLN
INTRAMUSCULAR | Status: DC | PRN
  Administered 2022-04-19: 100 ug via INTRAVENOUS

## 2022-04-19 MED ORDER — BRIMONIDINE TARTRATE-TIMOLOL 0.2-0.5 % OP SOLN
OPHTHALMIC | Status: DC | PRN
  Administered 2022-04-19: 1 [drp] via OPHTHALMIC

## 2022-04-19 MED ORDER — TETRACAINE HCL 0.5 % OP SOLN
1.0000 [drp] | OPHTHALMIC | Status: DC | PRN
  Administered 2022-04-19 (×3): 1 [drp] via OPHTHALMIC

## 2022-04-19 MED ORDER — ARMC OPHTHALMIC DILATING DROPS
1.0000 | OPHTHALMIC | Status: DC | PRN
  Administered 2022-04-19 (×3): 1 via OPHTHALMIC

## 2022-04-19 MED ORDER — SIGHTPATH DOSE#1 BSS IO SOLN
INTRAOCULAR | Status: DC | PRN
  Administered 2022-04-19: 51 mL via OPHTHALMIC

## 2022-04-19 MED ORDER — SIGHTPATH DOSE#1 BSS IO SOLN
INTRAOCULAR | Status: DC | PRN
  Administered 2022-04-19: 1 mL

## 2022-04-19 MED ORDER — SIGHTPATH DOSE#1 BSS IO SOLN
INTRAOCULAR | Status: DC | PRN
  Administered 2022-04-19: 15 mL

## 2022-04-19 MED ORDER — CEFUROXIME OPHTHALMIC INJECTION 1 MG/0.1 ML
INJECTION | OPHTHALMIC | Status: DC | PRN
  Administered 2022-04-19: 0.1 mL via INTRACAMERAL

## 2022-04-19 SURGICAL SUPPLY — 21 items
CANNULA ANT/CHMB 27G (MISCELLANEOUS) IMPLANT
CANNULA ANT/CHMB 27GA (MISCELLANEOUS) IMPLANT
CATARACT SUITE SIGHTPATH (MISCELLANEOUS) ×2 IMPLANT
FEE CATARACT SUITE SIGHTPATH (MISCELLANEOUS) ×1 IMPLANT
GLOVE SRG 8 PF TXTR STRL LF DI (GLOVE) ×1 IMPLANT
GLOVE SURG ENC TEXT LTX SZ7.5 (GLOVE) ×2 IMPLANT
GLOVE SURG GAMMEX PI TX LF 7.5 (GLOVE) IMPLANT
GLOVE SURG UNDER POLY LF SZ8 (GLOVE) ×2
LENS IOL TECNIS EYHANCE 26.0 (Intraocular Lens) ×1 IMPLANT
NDL FILTER BLUNT 18X1 1/2 (NEEDLE) ×1 IMPLANT
NDL RETROBULBAR .5 NSTRL (NEEDLE) IMPLANT
NEEDLE FILTER BLUNT 18X 1/2SAF (NEEDLE) ×2
NEEDLE FILTER BLUNT 18X1 1/2 (NEEDLE) ×1 IMPLANT
PACK VIT ANT 23G (MISCELLANEOUS) IMPLANT
RING MALYGIN 7.0 (MISCELLANEOUS) IMPLANT
SUT ETHILON 10-0 CS-B-6CS-B-6 (SUTURE)
SUT VICRYL  9 0 (SUTURE)
SUT VICRYL 9 0 (SUTURE) IMPLANT
SUTURE EHLN 10-0 CS-B-6CS-B-6 (SUTURE) IMPLANT
SYR 3ML LL SCALE MARK (SYRINGE) ×2 IMPLANT
WATER STERILE IRR 250ML POUR (IV SOLUTION) ×2 IMPLANT

## 2022-04-19 NOTE — Transfer of Care (Signed)
Immediate Anesthesia Transfer of Care Note  Patient: Molly Salas  Procedure(s) Performed: CATARACT EXTRACTION PHACO AND INTRAOCULAR LENS PLACEMENT ([facility]) RIGHT (Right: Eye)  Patient Location: PACU  Anesthesia Type: MAC  Level of Consciousness: awake, alert  and patient cooperative  Airway and Oxygen Therapy: Patient Spontanous Breathing and Patient connected to supplemental oxygen  Post-op Assessment: Post-op Vital signs reviewed, Patient's Cardiovascular Status Stable, Respiratory Function Stable, Patent Airway and No signs of Nausea or vomiting  Post-op Vital Signs: Reviewed and stable  Complications: There were no known notable events for this encounter.

## 2022-04-19 NOTE — H&P (Signed)
[facility]   Primary Care Physician:  Marya Fossa, PA-C Ophthalmologist: Dr. Lockie Mola  Pre-Procedure History & Physical: HPI:  Molly Salas is a 71 y.o. female here for ophthalmic surgery.   Past Medical History:  Diagnosis Date   GERD (gastroesophageal reflux disease)    Hypertension    Pre-diabetes     Past Surgical History:  Procedure Laterality Date   CATARACT EXTRACTION W/PHACO Left 04/05/2022   Procedure: CATARACT EXTRACTION PHACO AND INTRAOCULAR LENS PLACEMENT (IOC) LEFT 7.40 01:18.6;  Surgeon: Lockie Mola, MD;  Location: [facility];  Service: Ophthalmology;  Laterality: Left;    Prior to Admission medications   Medication Sig Start Date End Date Taking? Authorizing Provider  aspirin EC 81 MG tablet Take 81 mg by mouth daily.   Yes [provider]  cholecalciferol (VITAMIN D3) 25 MCG (1000 UNIT) tablet Take 1,000 Units by mouth daily.   Yes [provider]  gabapentin (NEURONTIN) 300 MG capsule Take 300 mg by mouth at bedtime.   Yes [provider]  hydrochlorothiazide (HYDRODIURIL) 12.5 MG tablet Take 12.5 mg by mouth.   Yes [provider]  lisinopril-hydrochlorothiazide (ZESTORETIC) 10-12.5 MG tablet Take 1 tablet by mouth daily.   Yes [provider]  Multiple Vitamin (MULTIVITAMIN) tablet Take 1 tablet by mouth daily.   Yes [provider]  naproxen (NAPROSYN) 500 MG tablet Take 500 mg by mouth 2 (two) times daily with a meal.   Yes [provider]  omeprazole (PRILOSEC) 20 MG capsule Take 20 mg by mouth daily.   Yes [provider]  pravastatin (PRAVACHOL) 20 MG tablet Take 20 mg by mouth daily.   Yes [provider]  traMADol (ULTRAM) 50 MG tablet Take 1 tablet (50 mg total) by mouth every 12 (twelve) hours as needed. 12/09/19  Yes Joni Reining, PA-C    Allergies as of 01/18/2022   (No Known Allergies)    Family History  Problem  Relation Age of Onset   Breast cancer Neg Hx     Social History   Socioeconomic History   Marital status: Married    Spouse name: Not on file   Number of children: Not on file   Years of education: Not on file   Highest education level: Not on file  Occupational History   Not on file  Tobacco Use   Smoking status: Never    Passive exposure: Current   Smokeless tobacco: Never  Vaping Use   Vaping Use: Never used  Substance and Sexual Activity   Alcohol use: Not on file   Drug use: Not on file   Sexual activity: Not on file  Other Topics Concern   Not on file  Social History Narrative   Not on file   Social Determinants of Health   Financial Resource Strain: Not on file  Food Insecurity: Not on file  Transportation Needs: Not on file  Physical Activity: Not on file  Stress: Not on file  Social Connections: Not on file  Intimate Partner Violence: Not on file    Review of Systems: See HPI, otherwise negative ROS  Physical Exam: BP (!) 150/77   Pulse 84   Temp 97.9 F (36.6 C) (Temporal)   Resp 18   Ht 4\' 11"  (1.499 m)   Wt 68 kg   SpO2 98%   BMI 30.28 kg/m  General:   Alert,  pleasant and cooperative in NAD Head:  Normocephalic and atraumatic. Lungs:  Clear to auscultation.    Heart:  Regular rate and rhythm.   Impression/Plan: Molly Salas is here for ophthalmic surgery.  Risks, benefits, limitations, and alternatives regarding ophthalmic surgery have been reviewed with the patient.  Questions have been answered.  All parties agreeable.   Lockie Mola, MD  04/19/2022, 11:15 AM

## 2022-04-19 NOTE — Op Note (Signed)
LOCATION:  [facility]   PREOPERATIVE DIAGNOSIS:    Nuclear sclerotic cataract right eye. H25.11   POSTOPERATIVE DIAGNOSIS:  Nuclear sclerotic cataract right eye.     PROCEDURE:  Phacoemusification with posterior chamber intraocular lens placement of the right eye   ULTRASOUND TIME: Procedure(s) with comments: CATARACT EXTRACTION PHACO AND INTRAOCULAR LENS PLACEMENT (IOC) RIGHT (Right) - 9.00 0:56.3  LENS:   Implant Name Type Inv. Item Serial No. Manufacturer Lot No. LRB No. Used Action  LENS IOL TECNIS EYHANCE 26.0 - [id number] Intraocular Lens LENS IOL TECNIS EYHANCE 26.0 [id number] [employer]  Right 1 Implanted         SURGEON:  Deirdre Evener, MD   ANESTHESIA:  Topical with tetracaine drops and 2% Xylocaine jelly, augmented with 1% preservative-free intracameral lidocaine.    COMPLICATIONS:  None.   DESCRIPTION OF PROCEDURE:  The patient was identified in the holding room and transported to the operating room and placed in the supine position under the operating microscope.  The right eye was identified as the operative eye and it was prepped and draped in the usual sterile ophthalmic fashion.   A 1 millimeter clear-corneal paracentesis was made at the 12:00 position.  0.5 ml of preservative-free 1% lidocaine was injected into the anterior chamber. The anterior chamber was filled with Viscoat viscoelastic.  A 2.4 millimeter keratome was used to make a near-clear corneal incision at the 9:00 position.  A curvilinear capsulorrhexis was made with a cystotome and capsulorrhexis forceps.  Balanced salt solution was used to hydrodissect and hydrodelineate the nucleus.   Phacoemulsification was then used in stop and chop fashion to remove the lens nucleus and epinucleus.  The remaining cortex was then removed using the irrigation and aspiration handpiece. Provisc was then placed into the capsular bag to distend it for lens placement.  A lens was then injected into the  capsular bag.  The remaining viscoelastic was aspirated.   Wounds were hydrated with balanced salt solution.  The anterior chamber was inflated to a physiologic pressure with balanced salt solution.  No wound leaks were noted. Cefuroxime 0.1 ml of a 10mg /ml solution was injected into the anterior chamber for a dose of 1 mg of intracameral antibiotic at the completion of the case.   Timolol and Brimonidine drops were applied to the eye.  The patient was taken to the recovery room in stable condition without complications of anesthesia or surgery.   [name] 04/19/2022, 11:56 AM

## 2022-04-19 NOTE — Anesthesia Postprocedure Evaluation (Signed)
Anesthesia Post Note  Patient: Molly Salas  Procedure(s) Performed: CATARACT EXTRACTION PHACO AND INTRAOCULAR LENS PLACEMENT ([place]) RIGHT (Right: Eye)     Patient location during evaluation: PACU Anesthesia Type: MAC Level of consciousness: awake and alert Pain management: pain level controlled Vital Signs Assessment: post-procedure vital signs reviewed and stable Respiratory status: spontaneous breathing, nonlabored ventilation, respiratory function stable and patient connected to nasal cannula oxygen Cardiovascular status: stable and blood pressure returned to baseline Postop Assessment: no apparent nausea or vomiting Anesthetic complications: no   There were no known notable events for this encounter.  Martha Clan

## 2022-04-19 NOTE — Anesthesia Preprocedure Evaluation (Signed)
Anesthesia Evaluation  Patient identified by MRN, date of birth, ID band Patient awake    Reviewed: Allergy & Precautions, H&P , NPO status , Patient's Chart, lab work & pertinent test results, reviewed documented beta blocker date and time   History of Anesthesia Complications Negative for: history of anesthetic complications  Airway Mallampati: II  TM Distance: >3 FB Neck ROM: full    Dental no notable dental hx. (+) Teeth Intact   Pulmonary neg pulmonary ROS,    Pulmonary exam normal breath sounds clear to auscultation       Cardiovascular Exercise Tolerance: Good hypertension, On Medications (-) angina(-) Past MI and (-) Cardiac Stents (-) dysrhythmias (-) Valvular Problems/Murmurs Rhythm:regular Rate:Normal     Neuro/Psych negative neurological ROS  negative psych ROS   GI/Hepatic Neg liver ROS, GERD  Medicated,  Endo/Other  negative endocrine ROSdiabetes, Well Controlled, Type 2, Oral Hypoglycemic Agents  Renal/GU      Musculoskeletal   Abdominal   Peds  Hematology negative hematology ROS (+)   Anesthesia Other Findings Past Medical History: No date: GERD (gastroesophageal reflux disease) No date: Hypertension No date: Pre-diabetes   Reproductive/Obstetrics negative OB ROS                             Anesthesia Physical  Anesthesia Plan  ASA: 2  Anesthesia Plan: MAC   Post-op Pain Management:    Induction:   PONV Risk Score and Plan:   Airway Management Planned:   Additional Equipment:   Intra-op Plan:   Post-operative Plan:   Informed Consent: I have reviewed the patients History and Physical, chart, labs and discussed the procedure including the risks, benefits and alternatives for the proposed anesthesia with the patient or authorized representative who has indicated his/her understanding and acceptance.       Plan Discussed with: CRNA  Anesthesia Plan  Comments:         Anesthesia Quick Evaluation

## 2022-04-20 ENCOUNTER — Encounter: Payer: Self-pay | Admitting: Ophthalmology

## 2022-12-20 ENCOUNTER — Other Ambulatory Visit: Payer: Self-pay | Admitting: Primary Care

## 2022-12-20 DIAGNOSIS — Z1231 Encounter for screening mammogram for malignant neoplasm of breast: Secondary | ICD-10-CM

## 2023-01-01 ENCOUNTER — Ambulatory Visit
Admission: RE | Admit: 2023-01-01 | Discharge: 2023-01-01 | Disposition: A | Discharge status: Home / Self Care | Payer: Medicaid Other | Source: Ambulatory Visit | Attending: Primary Care | Admitting: Primary Care

## 2023-01-01 DIAGNOSIS — Z1231 Encounter for screening mammogram for malignant neoplasm of breast: Secondary | ICD-10-CM | POA: Diagnosis present

## 2023-07-05 IMAGING — MG MM DIGITAL SCREENING BILAT W/ TOMO AND CAD
8 series · 8 of 24 positions shown · non-contrast
Comparison: Previous exam(s).

CLINICAL DATA: Screening.

EXAM:
DIGITAL SCREENING BILATERAL MAMMOGRAM WITH TOMOSYNTHESIS AND CAD
TECHNIQUE: Bilateral screening digital craniocaudal and mediolateral oblique
mammograms were obtained. Bilateral screening digital breast
tomosynthesis was performed. The images were evaluated with
computer-aided detection.

[R MLO synth-2D]
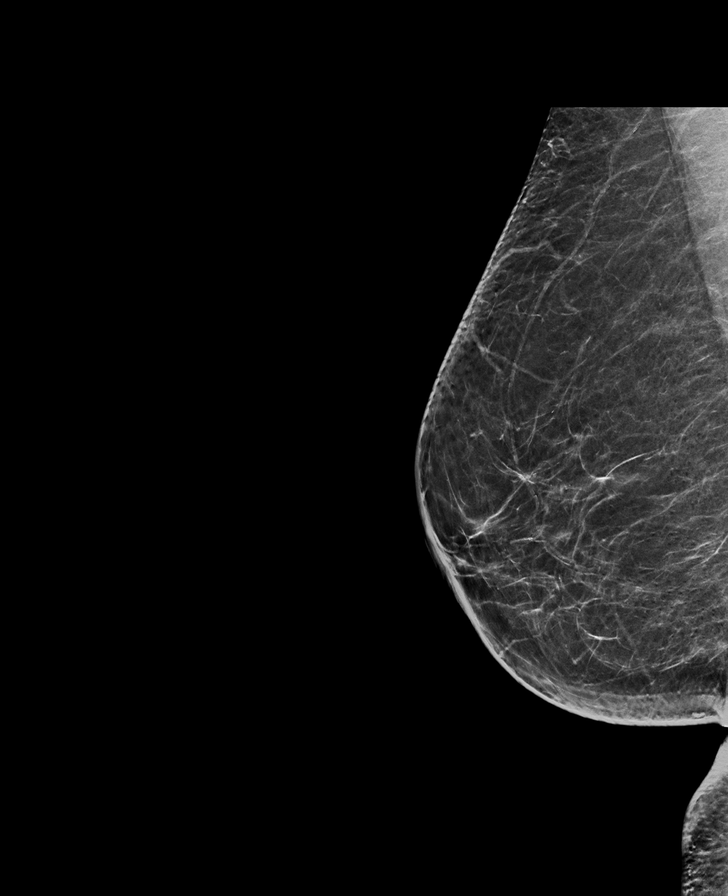

[L MLO synth-2D]
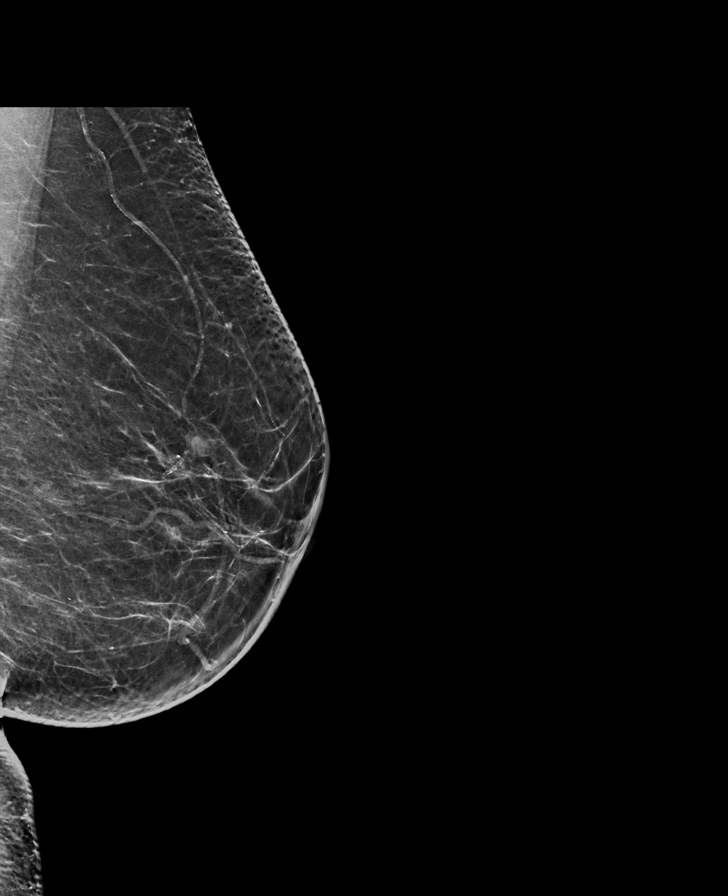

[L CC synth-2D]
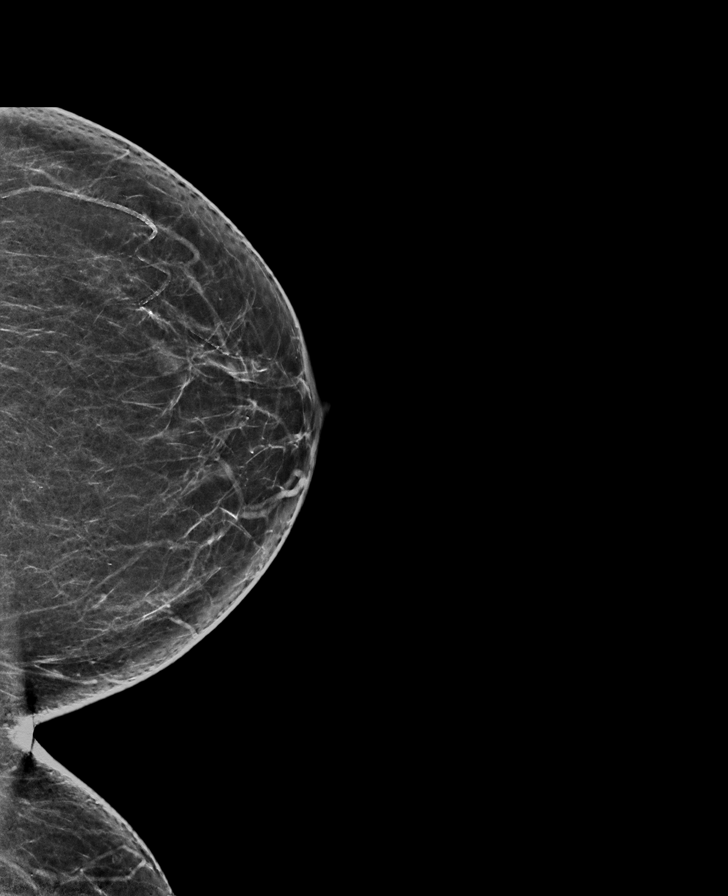

[R CC synth-2D]
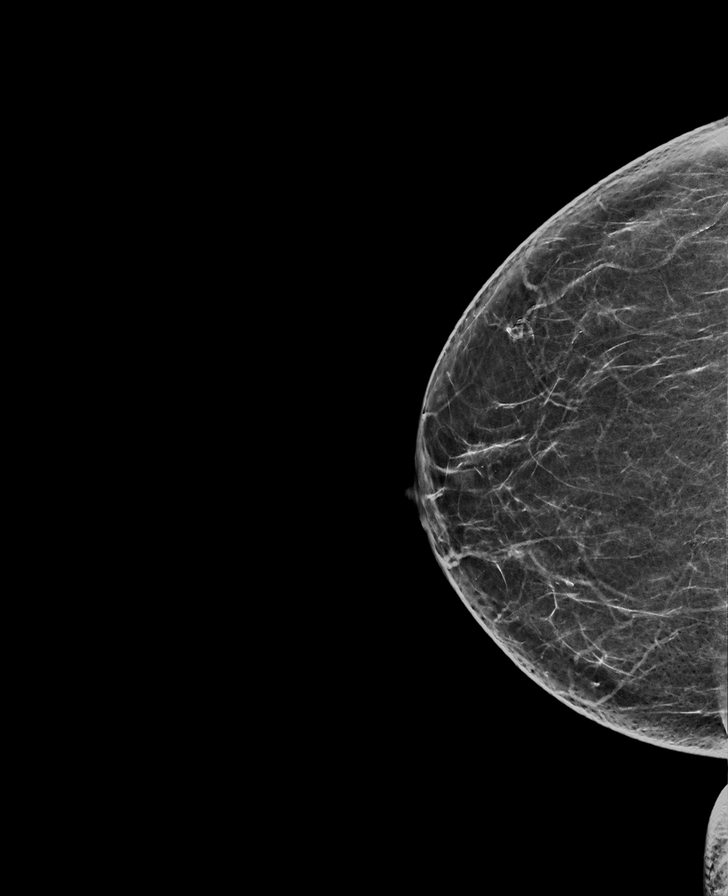

[L MLO tomo · tomo slice 33/65.0]
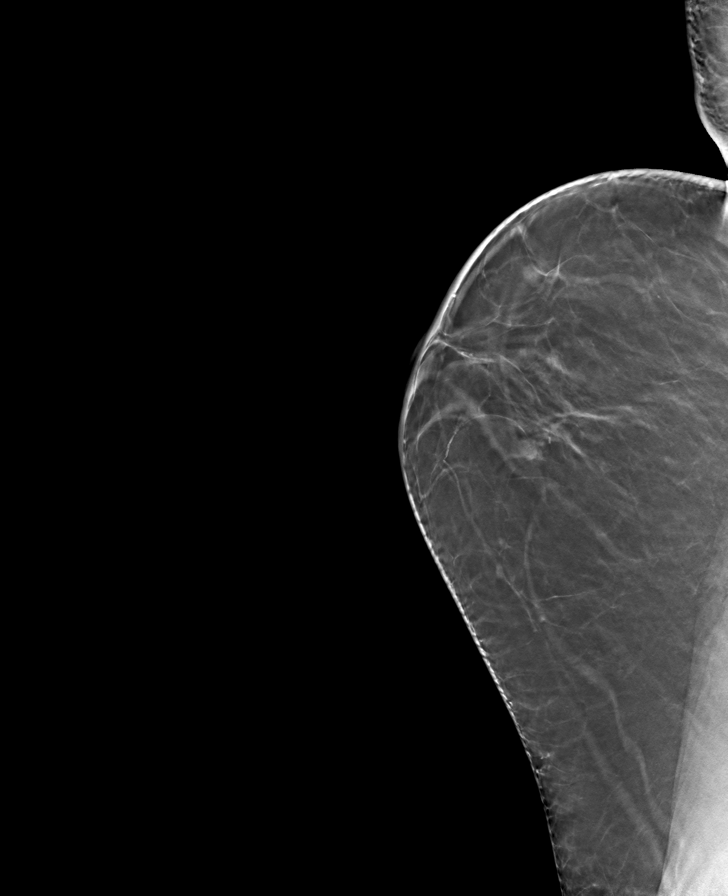

[L CC tomo · tomo slice 33/66.0]
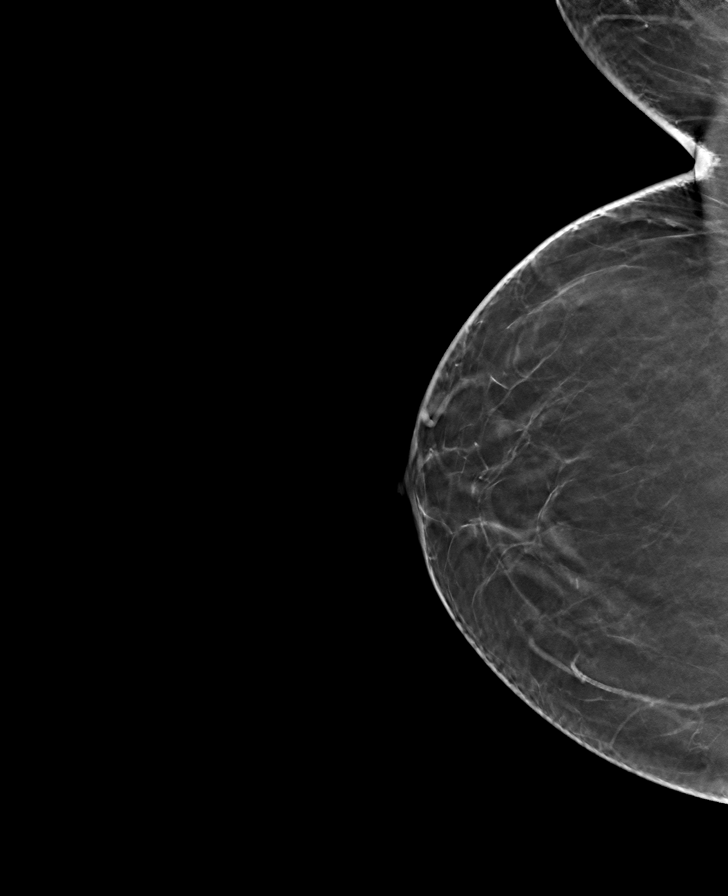

[R CC tomo · tomo slice 33/65.0]
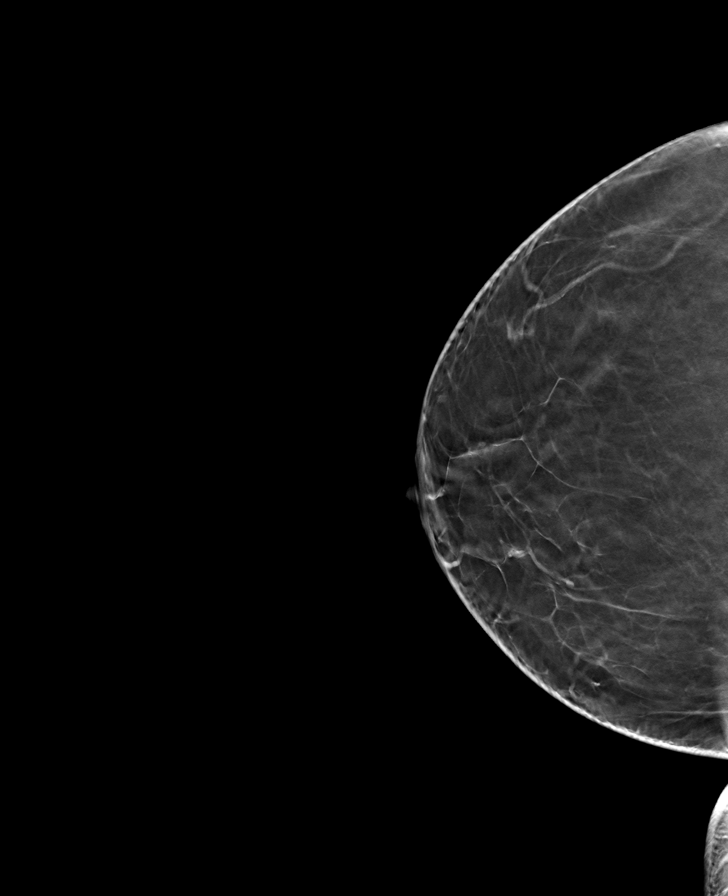

[R MLO tomo · tomo slice 33/66.0]
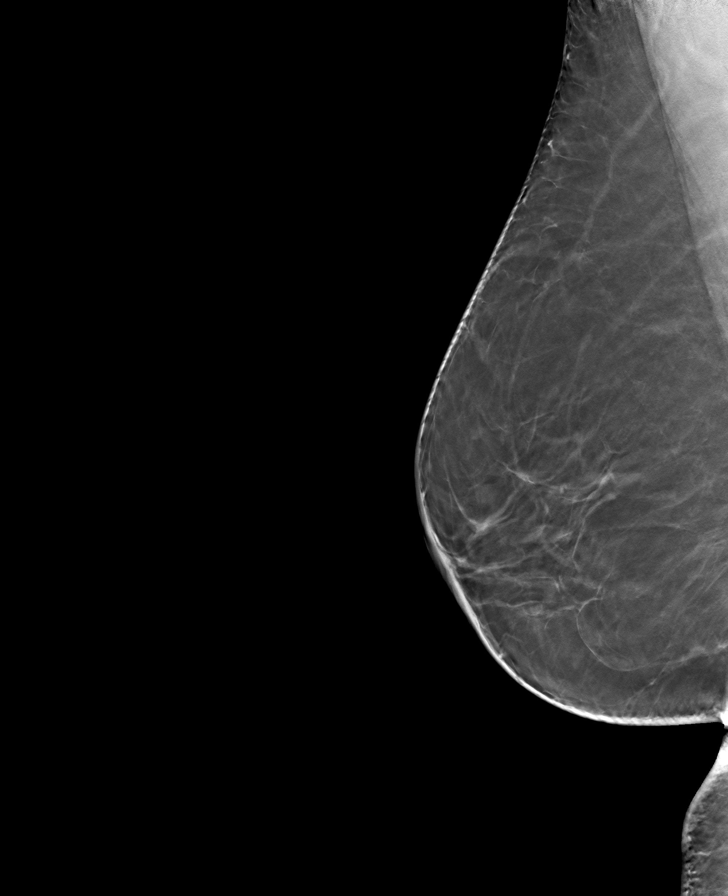

[8 of 24 positions shown; findings below may reference images not displayed]

ACR Breast Density Category b: There are scattered areas of
fibroglandular density.
FINDINGS: There are no findings suspicious for malignancy.
IMPRESSION: No mammographic evidence of malignancy. A result letter of this
screening mammogram will be mailed directly to the patient.

RECOMMENDATION:
Screening mammogram in one year. (Code:51-O-LD2)

BI-RADS CATEGORY  1: Negative.

## 2023-11-29 ENCOUNTER — Other Ambulatory Visit: Payer: Self-pay | Admitting: Primary Care

## 2023-11-29 DIAGNOSIS — Z1231 Encounter for screening mammogram for malignant neoplasm of breast: Secondary | ICD-10-CM

## 2024-01-09 ENCOUNTER — Ambulatory Visit
Admission: RE | Admit: 2024-01-09 | Discharge: 2024-01-09 | Disposition: A | Discharge status: Home / Self Care | Source: Ambulatory Visit | Attending: Primary Care | Admitting: Primary Care

## 2024-01-09 DIAGNOSIS — Z1231 Encounter for screening mammogram for malignant neoplasm of breast: Secondary | ICD-10-CM | POA: Insufficient documentation

## 2024-10-08 ENCOUNTER — Ambulatory Visit
Admission: EM | Admit: 2024-10-08 | Discharge: 2024-10-08 | Disposition: A | Discharge status: Home / Self Care | Source: Home / Self Care

## 2024-10-08 ENCOUNTER — Encounter: Payer: Self-pay | Admitting: Emergency Medicine

## 2024-10-08 DIAGNOSIS — S0512XA Contusion of eyeball and orbital tissues, left eye, initial encounter: Secondary | ICD-10-CM | POA: Diagnosis not present

## 2024-10-08 MED ORDER — AZELASTINE HCL 0.05 % OP SOLN: 1.0000 [drp] | Freq: Two times a day (BID) | OPHTHALMIC | 0 refills | Status: AC

## 2024-10-08 MED ORDER — AZELASTINE HCL 0.05 % OP SOLN: 1.0000 [drp] | Freq: Two times a day (BID) | OPHTHALMIC | 0 refills | Status: DC

## 2024-10-08 MED ORDER — ERYTHROMYCIN 5 MG/GM OP OINT: TOPICAL_OINTMENT | OPHTHALMIC | 0 refills | Status: DC

## 2024-10-08 MED ORDER — ERYTHROMYCIN 5 MG/GM OP OINT: TOPICAL_OINTMENT | OPHTHALMIC | 0 refills | Status: AC

## 2024-10-08 NOTE — Discharge Instructions (Addendum)
" °  1. Contusion of left eye, initial encounter (Primary) - azelastine  (OPTIVAR ) 0.05 % ophthalmic solution; Place 1 drop into both eyes 2 (two) times daily.  Dispense: 6 mL; Refill: 0 - erythromycin  ophthalmic ointment; Place a 1/2 inch ribbon of ointment into the left lower eyelid to prevent any secondary infection.  Dispense: 3 g; Refill: 0 - Follow-up with eye specialist as scheduled for further evaluation left eye injury with vision changes.  -Continue to monitor symptoms for any change in severity if there is any escalation of current symptoms or development of new symptoms follow-up in ER for further evaluation and management. "

## 2024-10-08 NOTE — ED Provider Notes (Signed)
 " [facility]  Note:  This document was prepared using Dragon voice recognition software and may include unintentional dictation errors.  MRN: [id number] DOB: [birth year]  Subjective:   Molly Salas is a 74 y.o. female presenting for left eye swelling, mild redness, pain after accidentally hitting herself in the eye while getting a pot out of the shelf.  Patient reports that she is seeing some black spots in the left lower part of her vision.  Patient had eye surgery about 4 years ago to correct cataracts.  Patient has been using over-the-counter remedies with minimal improvement to symptoms.  Patient was most concerned for pain and black spots in her vision.  Patient reports that she has a eye doctor appointment scheduled for further evaluation but was hoping for evaluation today in urgent care to make sure there think looks okay.  Current Medications[1]   Allergies[2]  Past Medical History:  Diagnosis Date   GERD (gastroesophageal reflux disease)    Hypertension    Pre-diabetes      Past Surgical History:  Procedure Laterality Date   CATARACT EXTRACTION W/PHACO Left 04/05/2022   Procedure: CATARACT EXTRACTION PHACO AND INTRAOCULAR LENS PLACEMENT (IOC) LEFT 7.40 01:18.6;  Surgeon: Mittie Gaskin, MD;  Location: [facility];  Service: Ophthalmology;  Laterality: Left;   CATARACT EXTRACTION W/PHACO Right 04/19/2022   Procedure: CATARACT EXTRACTION PHACO AND INTRAOCULAR LENS PLACEMENT (IOC) RIGHT;  Surgeon: Mittie Gaskin, MD;  Location: [facility];  Service: Ophthalmology;  Laterality: Right;  9.00 0:56.3    Family History  Problem Relation Age of Onset   Breast cancer Neg Hx     Social History[3]  ROS Refer to HPI for ROS details.  Objective:   Vitals: BP (!) 166/83 (BP Location: Left Arm)   Pulse 85   Temp 98.3 F (36.8 C) (Oral)   Resp 16   SpO2 98%   Physical Exam Vitals and nursing note reviewed.  Constitutional:       General: She is not in acute distress.    Appearance: Normal appearance. She is well-developed. She is not ill-appearing or toxic-appearing.  HENT:     Head: Normocephalic and atraumatic.  Eyes:     General: Lids are normal. Lids are everted, no foreign bodies appreciated. Vision grossly intact. Gaze aligned appropriately. No allergic shiner or visual field deficit.       Right eye: No foreign body or discharge.        Left eye: No foreign body or discharge.     Extraocular Movements: Extraocular movements intact.     Right eye: Normal extraocular motion.     Left eye: Normal extraocular motion.     Conjunctiva/sclera:     Right eye: Right conjunctiva is not injected. No exudate.    Left eye: Left conjunctiva is injected. No exudate.    Pupils: Pupils are equal, round, and reactive to light.   Cardiovascular:     Rate and Rhythm: Normal rate.  Pulmonary:     Effort: Pulmonary effort is normal. No respiratory distress.  Musculoskeletal:        General: Normal range of motion.  Skin:    General: Skin is warm and dry.  Neurological:     General: No focal deficit present.     Mental Status: She is alert and oriented to person, place, and time.  Psychiatric:        Mood and Affect: Mood normal.        Behavior: Behavior  normal.     Procedures  No results found for this or any previous visit (from the past 24 hours).  No results found.   Assessment and Plan :     Discharge Instructions       1. Contusion of left eye, initial encounter (Primary) - azelastine  (OPTIVAR ) 0.05 % ophthalmic solution; Place 1 drop into both eyes 2 (two) times daily.  Dispense: 6 mL; Refill: 0 - erythromycin  ophthalmic ointment; Place a 1/2 inch ribbon of ointment into the left lower eyelid to prevent any secondary infection.  Dispense: 3 g; Refill: 0 - Follow-up with eye specialist as scheduled for further evaluation left eye injury with vision changes.  -Continue to monitor symptoms for  any change in severity if there is any escalation of current symptoms or development of new symptoms follow-up in ER for further evaluation and management.       Selyna Klahn B Kaveh Kissinger    [1] No current facility-administered medications for this encounter.  Current Outpatient Medications:    aspirin EC 81 MG tablet, Take 81 mg by mouth daily., Disp: , Rfl:    azelastine  (OPTIVAR ) 0.05 % ophthalmic solution, Place 1 drop into both eyes 2 (two) times daily., Disp: 6 mL, Rfl: 0   cholecalciferol (VITAMIN D3) 25 MCG (1000 UNIT) tablet, Take 1,000 Units by mouth daily., Disp: , Rfl:    erythromycin  ophthalmic ointment, Place a 1/2 inch ribbon of ointment into the left lower eyelid to prevent any secondary infection., Disp: 3 g, Rfl: 0   gabapentin (NEURONTIN) 300 MG capsule, Take 300 mg by mouth at bedtime., Disp: , Rfl:    hydrochlorothiazide (HYDRODIURIL) 12.5 MG tablet, Take 12.5 mg by mouth., Disp: , Rfl:    lisinopril-hydrochlorothiazide (ZESTORETIC) 10-12.5 MG tablet, Take 1 tablet by mouth daily., Disp: , Rfl:    Multiple Vitamin (MULTIVITAMIN) tablet, Take 1 tablet by mouth daily., Disp: , Rfl:    naproxen (NAPROSYN) 500 MG tablet, Take 500 mg by mouth 2 (two) times daily with a meal., Disp: , Rfl:    omeprazole (PRILOSEC) 20 MG capsule, Take 20 mg by mouth daily., Disp: , Rfl:    pravastatin (PRAVACHOL) 20 MG tablet, Take 20 mg by mouth daily., Disp: , Rfl:    traMADol  (ULTRAM ) 50 MG tablet, Take 1 tablet (50 mg total) by mouth every 12 (twelve) hours as needed., Disp: 12 tablet, Rfl: 0 [2] No Known Allergies [3]  Social History Tobacco Use   Smoking status: Never    Passive exposure: Current   Smokeless tobacco: Never  Vaping Use   Vaping status: Never Used     Aurea Goodell B, NP 10/08/24 1737  "

## 2024-10-08 NOTE — ED Triage Notes (Addendum)
 Patient presents for injury to left eye this am.  Patient drop a pt and one of the legs of the pot hit her in the eye. Patient is seeing black spots.  Patient had surgery on her eye 4 years ago.   Patient put so eye oil and Aleo on the outside of her eye.
